# Patient Record
Sex: Female | Born: 2000 | Race: White | Hispanic: No | Marital: Single | Smoking: Never smoker
Health system: Southern US, Community
[De-identification: ages and names within clinical notes are randomized; demographics above are authoritative.]

## PROBLEM LIST (undated history)

## (undated) DIAGNOSIS — F429 Obsessive-compulsive disorder, unspecified: Secondary | ICD-10-CM

## (undated) DIAGNOSIS — F32A Depression, unspecified: Secondary | ICD-10-CM

## (undated) DIAGNOSIS — F319 Bipolar disorder, unspecified: Secondary | ICD-10-CM

## (undated) DIAGNOSIS — G259 Extrapyramidal and movement disorder, unspecified: Secondary | ICD-10-CM

## (undated) DIAGNOSIS — F952 Tourette's disorder: Secondary | ICD-10-CM

## (undated) DIAGNOSIS — H539 Unspecified visual disturbance: Secondary | ICD-10-CM

## (undated) DIAGNOSIS — F419 Anxiety disorder, unspecified: Secondary | ICD-10-CM

## (undated) HISTORY — DX: Extrapyramidal and movement disorder, unspecified: G25.9

## (undated) HISTORY — DX: Tourette's disorder: F95.2

## (undated) HISTORY — PX: FRACTURE SURGERY: SHX138

## (undated) HISTORY — DX: Depression, unspecified: F32.A

---
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
## 2022-01-03 ENCOUNTER — Ambulatory Visit
Admission: RE | Admit: 2022-01-03 | Discharge: 2022-01-03 | Disposition: A | Discharge status: Home / Self Care | Payer: Managed Care, Other (non HMO) | Source: Ambulatory Visit | Attending: Obstetrics and Gynecology | Admitting: Obstetrics and Gynecology

## 2022-01-03 DIAGNOSIS — N6325 Unspecified lump in the left breast, overlapping quadrants: Secondary | ICD-10-CM

## 2022-01-25 ENCOUNTER — Encounter (HOSPITAL_BASED_OUTPATIENT_CLINIC_OR_DEPARTMENT_OTHER): Payer: Self-pay

## 2022-01-25 DIAGNOSIS — R5383 Other fatigue: Secondary | ICD-10-CM

## 2022-01-25 DIAGNOSIS — G4700 Insomnia, unspecified: Secondary | ICD-10-CM

## 2022-01-31 ENCOUNTER — Encounter (HOSPITAL_BASED_OUTPATIENT_CLINIC_OR_DEPARTMENT_OTHER): Payer: Self-pay

## 2022-01-31 DIAGNOSIS — R454 Irritability and anger: Secondary | ICD-10-CM

## 2022-01-31 DIAGNOSIS — R5383 Other fatigue: Secondary | ICD-10-CM

## 2022-01-31 DIAGNOSIS — G4700 Insomnia, unspecified: Secondary | ICD-10-CM

## 2022-03-14 ENCOUNTER — Ambulatory Visit (HOSPITAL_BASED_OUTPATIENT_CLINIC_OR_DEPARTMENT_OTHER): Payer: Managed Care, Other (non HMO) | Attending: Pediatrics | Admitting: Internal Medicine

## 2022-03-14 DIAGNOSIS — R454 Irritability and anger: Secondary | ICD-10-CM | POA: Insufficient documentation

## 2022-03-14 DIAGNOSIS — G4700 Insomnia, unspecified: Secondary | ICD-10-CM | POA: Insufficient documentation

## 2022-03-14 DIAGNOSIS — R5383 Other fatigue: Secondary | ICD-10-CM | POA: Insufficient documentation

## 2022-03-14 DIAGNOSIS — G4733 Obstructive sleep apnea (adult) (pediatric): Secondary | ICD-10-CM | POA: Diagnosis not present

## 2022-03-19 DIAGNOSIS — G4700 Insomnia, unspecified: Secondary | ICD-10-CM | POA: Diagnosis not present

## 2022-03-19 NOTE — Procedures (Signed)
   Patient Name: Jessica Pope, Jessica Pope Date: 03/15/2022 Gender: Female D.O.B: 2001/01/12 Age (years): 21 Referring Provider: Jay Schlichter Height (inches): 63 Interpreting Physician: Jetty Duhamel MD, ABSM Weight (lbs): 209 RPSGT: [place] Sink BMI: 38 MRN: [id number] Neck Size: 14.50  CLINICAL INFORMATION Sleep Study Type: HST Indication for sleep study: Insomnia with OSA Epworth Sleepiness Score: 4  SLEEP STUDY TECHNIQUE A multi-channel overnight portable sleep study was performed. The channels recorded were: nasal airflow, thoracic respiratory movement, and oxygen saturation with a pulse oximetry. Snoring was also monitored.  MEDICATIONS Patient self administered medications include: none reported.  SLEEP ARCHITECTURE Patient was studied for 364.5 minutes. The sleep efficiency was 100.0 % and the patient was supine for 0%. The arousal index was 0.0 per hour.  RESPIRATORY PARAMETERS The overall AHI was 6.3 per hour, with a central apnea index of 0 per hour. The oxygen nadir was 90% during sleep.  CARDIAC DATA Mean heart rate during sleep was 76.2 bpm.  IMPRESSIONS - Mild obstructive sleep apnea occurred during this study (AHI = 6.3/h). - The patient had minimal or no oxygen desaturation during the study (Min O2 = 90%) - Patient snored.  DIAGNOSIS - Obstructive Sleep Apnea (G47.33)  RECOMMENDATIONS - Treatment for very mild OSA is directed by symptoms and co-morbidity. Conservative measures may include observation, weight loss and sleep position off back. Other options, including CPAP, a fitted oral appliance, or ENT evaluation, would be based on clinical judgment. - Management for insomnia may be appropriate. - Sleep hygiene should be reviewed to assess factors that may improve sleep quality. - Weight management and regular exercise should be initiated or continued.  [Electronically signed] 03/19/2022 10:53 AM  Jetty Duhamel MD, ABSM Diplomate, American Board  of Sleep Medicine NPI: [id number]                          Jetty Duhamel Diplomate, American Board of Sleep Medicine  ELECTRONICALLY SIGNED ON:  03/19/2022, 10:49 AM [facility] PH: [phone]   FX: [phone] ACCREDITED BY THE AMERICAN ACADEMY OF SLEEP MEDICINE

## 2024-01-01 IMAGING — US US BREAST*L* LIMITED INC AXILLA
1 series · 4 of 4 positions shown · non-contrast
Comparison: None Available.

CLINICAL DATA: 1 centimeter palpable abnormality in the 6 o'clock
location of the LEFT breast, noted on recent physical exam by her
provider. Family history of breast cancer in her paternal
grandmother age 39.

EXAM:
ULTRASOUND OF THE LEFT BREAST

[Series 1: us breast*left* limited inc axilla · 0.08mm/px · 4 of 4 slices shown]
[im 1/4]
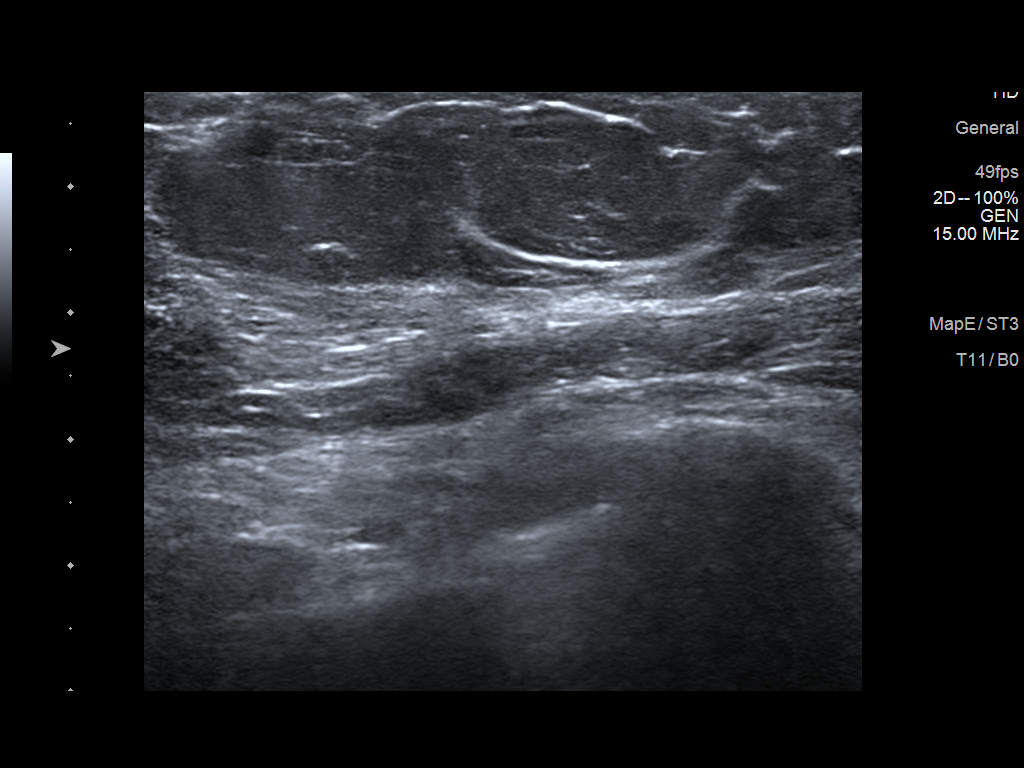
[im 2/4]
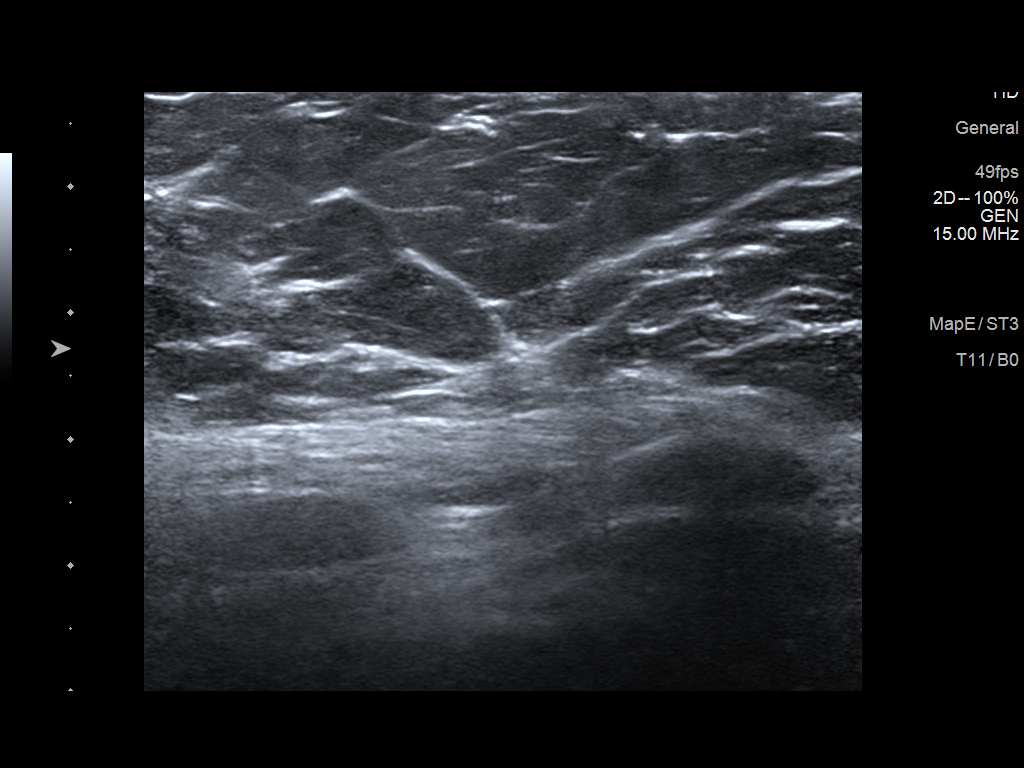
[im 3/4]
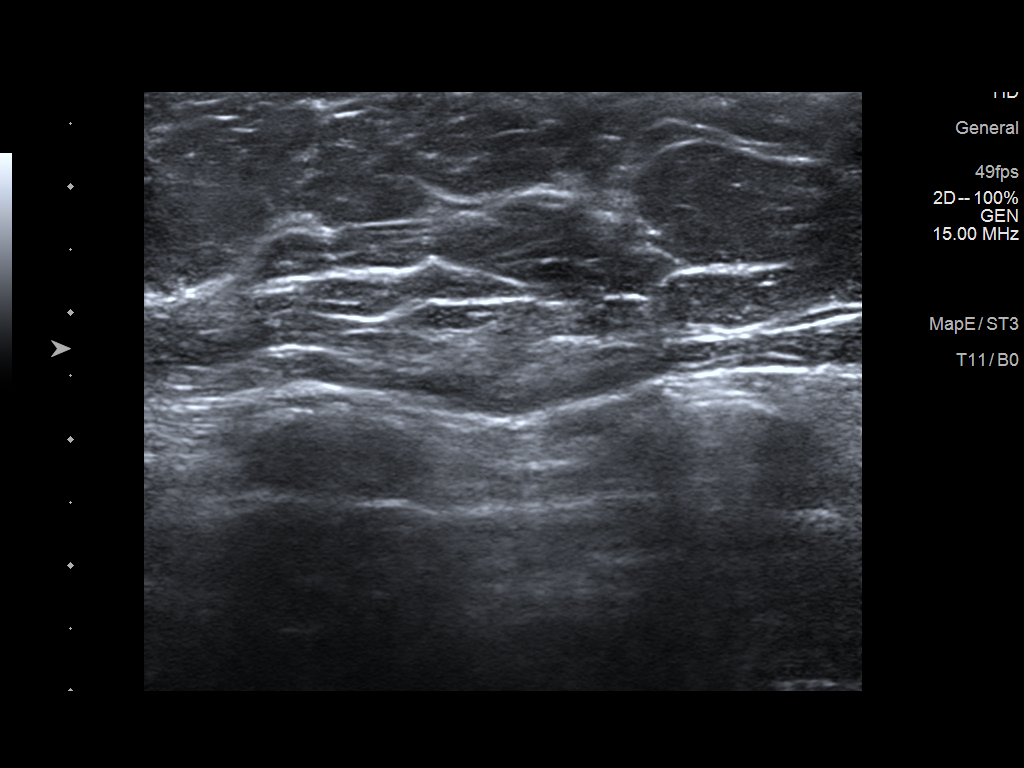
[im 4/4]
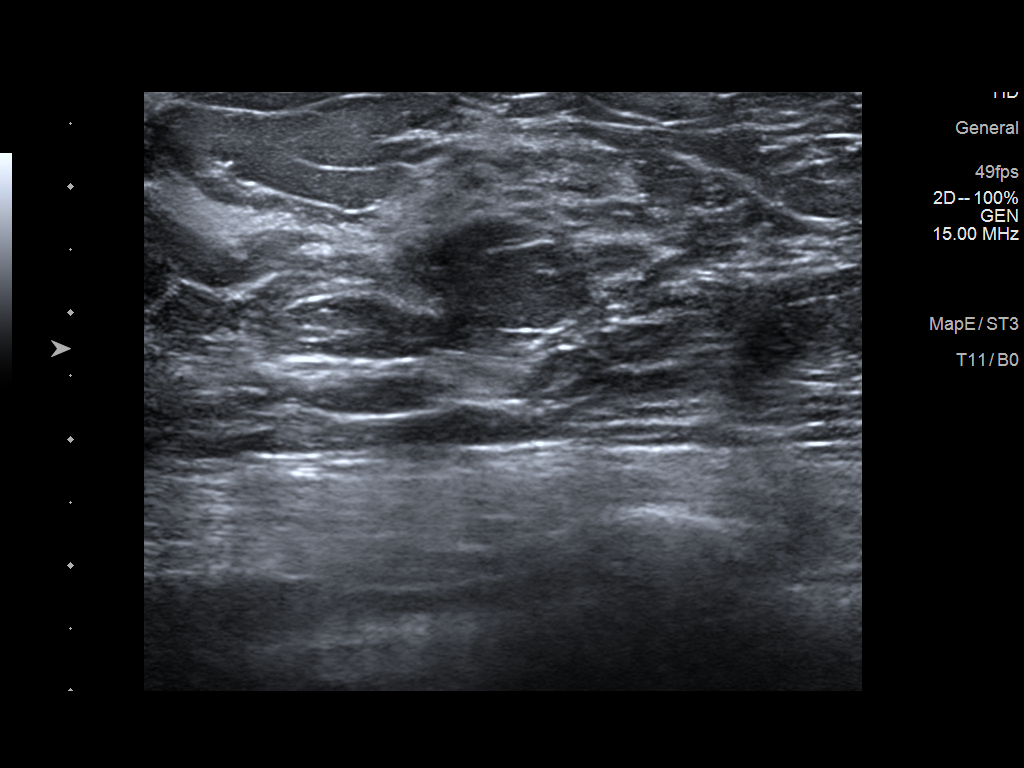

[4 of 4 positions shown; findings below may reference images not displayed]

FINDINGS: On physical exam, I palpate no discrete mass in the 6 o'clock
location of the LEFT breast.

Targeted ultrasound is performed, showing normal appearing
fibroglandular tissue in the LOWER central LEFT breast. No
suspicious mass, distortion, or acoustic shadowing is demonstrated
with ultrasound.
IMPRESSION: There is no sonographic evidence for malignancy.

RECOMMENDATION:
Recommend screening mammogram at age 40 unless there are persistent
or intervening clinical concerns. (Code:9N-R-AA1)

I have discussed the findings and recommendations with the patient
and her mother. If applicable, a reminder letter will be sent to the
patient regarding the next appointment.

BI-RADS CATEGORY  1: Negative.
